# Patient Record
Sex: Female | Born: 1995 | Race: White | Hispanic: No | Marital: Single | Smoking: Current every day smoker
Health system: Southern US, Community
[De-identification: ages and names within clinical notes are randomized; demographics above are authoritative.]

---
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
## 2014-03-01 ENCOUNTER — Ambulatory Visit (INDEPENDENT_AMBULATORY_CARE_PROVIDER_SITE_OTHER): Payer: Medicaid Other | Admitting: Nurse Practitioner

## 2014-03-01 VITALS — BP 107/73 | HR 76 | Temp 98.6°F | Ht 65.01 in | Wt 181.0 lb

## 2014-03-01 DIAGNOSIS — N912 Amenorrhea, unspecified: Secondary | ICD-10-CM

## 2014-03-01 DIAGNOSIS — B354 Tinea corporis: Secondary | ICD-10-CM

## 2014-03-01 LAB — POCT URINE PREGNANCY: Preg Test, Ur: NEGATIVE

## 2014-03-01 MED ORDER — NORGESTIM-ETH ESTRAD TRIPHASIC 0.18/0.215/0.25 MG-35 MCG PO TABS: 1.0000 | ORAL_TABLET | Freq: Every day | ORAL | Status: AC

## 2014-03-01 MED ORDER — CLOTRIMAZOLE 1 % EX CREA: 1.0000 "application " | TOPICAL_CREAM | Freq: Two times a day (BID) | CUTANEOUS | Status: AC

## 2014-03-01 NOTE — Patient Instructions (Signed)

## 2014-03-01 NOTE — Progress Notes (Signed)
   Subjective:    Patient ID: Byanka Connett, female    DOB: 03/17/1996, 18 y.o.   MRN: [id number]  HPI Patient in c/o a rash on right upper flank- has bben there for 2 weeks  * Patient also c/o no menses in over 3 months she thinks- she really does not keep up with it.  Review of Systems  Constitutional: Negative.   HENT: Negative.   Respiratory: Negative.   Cardiovascular: Negative.   Genitourinary: Negative.   Neurological: Negative.   Psychiatric/Behavioral: Negative.   All other systems reviewed and are negative.      Objective:   Physical Exam  Constitutional: She is oriented to person, place, and time. She appears well-developed and well-nourished.  Cardiovascular: Normal rate, regular rhythm and normal heart sounds.   Pulmonary/Chest: Effort normal and breath sounds normal.  Abdominal: Soft. Bowel sounds are normal.  Neurological: She is alert and oriented to person, place, and time.  Skin:  4 cm annular macular lesion with central clearing right upper flank.    BP 107/73 mmHg  Pulse 76  Temp(Src) 98.6 F (37 C) (Oral)  Ht 5' 5.01" (1.651 m)  Wt 181 lb (82.101 kg)  BMI 30.12 kg/m2  LMP 11/29/2013       Assessment & Plan:  1. Amenorrhea DO NOT start birth control until have a period- then start them on Sunday after wards Safe sex encouraged - POCT urine pregnancy - Norgestimate-Ethinyl Estradiol Triphasic 0.18/0.215/0.25 MG-35 MCG tablet; Take 1 tablet by mouth daily.  Dispense: 1 Package; Refill: 11  2. Tinea corporis Avoid scratching or rubbing Good hand washing after applying cream Can take up to 6 weeks to completely go away - clotrimazole (LOTRIMIN AF) 1 % cream; Apply 1 application topically 2 (two) times daily.  Dispense: 30 g; Refill: 0   Mary-Margaret Daphine DeutscherMartin, FNP

## 2014-05-14 ENCOUNTER — Ambulatory Visit: Payer: Medicaid Other | Admitting: Family

## 2014-08-03 ENCOUNTER — Emergency Department (HOSPITAL_COMMUNITY)
Admission: EM | Admit: 2014-08-03 | Discharge: 2014-08-03 | Disposition: A | Discharge status: Home / Self Care | Payer: Medicaid Other | Attending: Emergency Medicine | Admitting: Emergency Medicine

## 2014-08-03 ENCOUNTER — Encounter (HOSPITAL_COMMUNITY): Payer: Self-pay | Admitting: Emergency Medicine

## 2014-08-03 DIAGNOSIS — R69 Illness, unspecified: Secondary | ICD-10-CM

## 2014-08-03 DIAGNOSIS — Z79899 Other long term (current) drug therapy: Secondary | ICD-10-CM | POA: Diagnosis not present

## 2014-08-03 DIAGNOSIS — R1110 Vomiting, unspecified: Secondary | ICD-10-CM | POA: Diagnosis present

## 2014-08-03 DIAGNOSIS — Z720 Tobacco use: Secondary | ICD-10-CM | POA: Diagnosis not present

## 2014-08-03 DIAGNOSIS — J111 Influenza due to unidentified influenza virus with other respiratory manifestations: Secondary | ICD-10-CM | POA: Diagnosis not present

## 2014-08-03 MED ORDER — BENZONATATE 100 MG PO CAPS
100.0000 mg | ORAL_CAPSULE | Freq: Once | ORAL | Status: AC
  Administered 2014-08-03: 100 mg via ORAL
  Filled 2014-08-03: qty 1

## 2014-08-03 MED ORDER — ALBUTEROL SULFATE HFA 108 (90 BASE) MCG/ACT IN AERS
2.0000 | INHALATION_SPRAY | RESPIRATORY_TRACT | Status: AC
  Administered 2014-08-03: 2 via RESPIRATORY_TRACT
  Filled 2014-08-03: qty 6.7

## 2014-08-03 MED ORDER — IBUPROFEN 800 MG PO TABS: 800.0000 mg | ORAL_TABLET | Freq: Three times a day (TID) | ORAL | Status: DC

## 2014-08-03 MED ORDER — BENZONATATE 100 MG PO CAPS: 100.0000 mg | ORAL_CAPSULE | Freq: Three times a day (TID) | ORAL | Status: AC

## 2014-08-03 MED ORDER — KETOROLAC TROMETHAMINE 60 MG/2ML IM SOLN
60.0000 mg | Freq: Once | INTRAMUSCULAR | Status: AC
  Administered 2014-08-03: 60 mg via INTRAMUSCULAR
  Filled 2014-08-03: qty 2

## 2014-08-03 NOTE — Discharge Instructions (Signed)
Please call your doctor for a followup appointment within 24-48 hours. When you talk to your doctor please let them know that you were seen in the emergency department and have them acquire all of your records so that they can discuss the findings with you and formulate a treatment plan to fully care for your new and ongoing problems. ° °

## 2014-08-03 NOTE — ED Notes (Signed)
Pt reports cough,emesis,headache,diarrhea,ear pain x3 days.

## 2014-08-03 NOTE — ED Provider Notes (Signed)
CSN: [id number]     Arrival date & time 08/03/14  1144 History  This chart was scribed for Stephanie HongBrian Shenay Torti, MD by Phillis HaggisGabriella Gaje, ED Scribe. This patient was seen in room APA19/[facility]19 and patient care was started at 12:02 PM.   Chief Complaint  Patient presents with  . Emesis   Patient is a 19 y.o. female presenting with vomiting. The history is provided by the patient. No language interpreter was used.  Emesis Associated symptoms: headaches and myalgias     HPI Comments: Stephanie Walls is a 19 y.o. female who presents to the Emergency Department complaining of cough, migraine, and chest pain onset 3 days ago.  She reports most pain in her chest from her coughing. She reports associated subjective fever, chills, runny nose, one episode of hematochezia, diarrhea, vomiting, and sore throat. She denies a productive cough states that the purulence from her nose is clear. She denies taking a flu shot this year.   Symptoms are persistent, gradually worsening, not associated with rashes or swelling of the lower extremities  She does have a sick contact in a best friend with similar symptoms.   History reviewed. No pertinent past medical history. History reviewed. No pertinent past surgical history. History reviewed. No pertinent family history. History  Substance Use Topics  . Smoking status: Current Every Day Smoker -- 1.00 packs/day  . Smokeless tobacco: Not on file  . Alcohol Use: Yes     Comment: 1 bottle of liquor every 2 days.   OB History    No data available     Review of Systems  Constitutional: Positive for fever (subjective).  Respiratory: Positive for cough.   Musculoskeletal: Positive for myalgias.  Neurological: Positive for headaches.  All other systems reviewed and are negative.  Allergies  Review of patient's allergies indicates no known allergies.  Home Medications   Prior to Admission medications   Medication Sig Start Date End Date Taking? Authorizing Provider   benzonatate (TESSALON) 100 MG capsule Take 1 capsule (100 mg total) by mouth every 8 (eight) hours. 08/03/14   Stephanie HongBrian Shruti Arrey, MD  clotrimazole (LOTRIMIN AF) 1 % cream Apply 1 application topically 2 (two) times daily. 03/01/14   Mary-Margaret Daphine DeutscherMartin, FNP  ibuprofen (ADVIL,MOTRIN) 800 MG tablet Take 1 tablet (800 mg total) by mouth 3 (three) times daily. 08/03/14   Stephanie HongBrian Renesmee Raine, MD  Norgestimate-Ethinyl Estradiol Triphasic 0.18/0.215/0.25 MG-35 MCG tablet Take 1 tablet by mouth daily. 03/01/14   Mary-Margaret Daphine DeutscherMartin, FNP   BP 95/66 mmHg  Pulse 99  Temp(Src) 99.2 F (37.3 C) (Oral)  Resp 18  Ht 5\' 7"  (1.702 m)  Wt 180 lb (81.647 kg)  BMI 28.19 kg/m2  SpO2 95%  LMP 06/04/2014   Physical Exam  Constitutional: She appears well-developed and well-nourished. No distress.  HENT:  Head: Normocephalic and atraumatic.  Mouth/Throat: Oropharynx is clear and moist. No oropharyngeal exudate.  Mild erythema to the posterior pharynx; frequent coughing during exam  Eyes: Conjunctivae and EOM are normal. Pupils are equal, round, and reactive to light. Right eye exhibits no discharge. Left eye exhibits no discharge. No scleral icterus.  Neck: Normal range of motion. Neck supple. No JVD present. No thyromegaly present.  Cardiovascular: Normal rate, regular rhythm, normal heart sounds and intact distal pulses.  Exam reveals no gallop and no friction rub.   No murmur heard. Pulmonary/Chest: Effort normal and breath sounds normal. No respiratory distress. She has no wheezes. She has no rales.  Abdominal: Soft. Bowel sounds are normal.  She exhibits no distension and no mass. There is no tenderness.  Musculoskeletal: Normal range of motion. She exhibits no edema or tenderness.  Lymphadenopathy:    She has no cervical adenopathy.  Neurological: She is alert. Coordination normal.  Skin: Skin is warm and dry. No rash noted. No erythema.  Psychiatric: She has a normal mood and affect. Her behavior is normal.   Nursing note and vitals reviewed.   ED Course  Procedures (including critical care time) DIAGNOSTIC STUDIES: Oxygen Saturation is 95% on room air, normal by my interpretation.    COORDINATION OF CARE: 12:12 PM-Discussed treatment plan which includes inhaler with pt at bedside and pt agreed to plan.   Labs Review Labs Reviewed - No data to display  Imaging Review No results found.    MDM   Final diagnoses:  Influenza-like illness     The patient is well-appearing, she does have frequent coughing but it is a dry nonproductive cough, she has normal lung sounds, her vital signs reveal no signs of hypoxia or fever, she is not tachycardic, she has likely developed a viral infection similar to her friend and is stable for discharge. Medications given as below.  Meds given in ED:  Medications  albuterol (PROVENTIL HFA;VENTOLIN HFA) 108 (90 BASE) MCG/ACT inhaler 2 puff (2 puffs Inhalation Given 08/03/14 1228)  ketorolac (TORADOL) injection 60 mg (60 mg Intramuscular Given 08/03/14 1228)  benzonatate (TESSALON) capsule 100 mg (100 mg Oral Given 08/03/14 1228)    Discharge Medication List as of 08/03/2014 12:19 PM    START taking these medications   Details  benzonatate (TESSALON) 100 MG capsule Take 1 capsule (100 mg total) by mouth every 8 (eight) hours., Starting 08/03/2014, Until Discontinued, Print    ibuprofen (ADVIL,MOTRIN) 800 MG tablet Take 1 tablet (800 mg total) by mouth 3 (three) times daily., Starting 08/03/2014, Until Discontinued, Print         I personally performed the services described in this documentation, which was scribed in my presence. The recorded information has been reviewed and is accurate.      Stephanie Hong, MD 08/03/14 [phone]

## 2014-08-04 DIAGNOSIS — Z793 Long term (current) use of hormonal contraceptives: Secondary | ICD-10-CM | POA: Diagnosis not present

## 2014-08-04 DIAGNOSIS — R079 Chest pain, unspecified: Secondary | ICD-10-CM | POA: Insufficient documentation

## 2014-08-04 DIAGNOSIS — Z720 Tobacco use: Secondary | ICD-10-CM | POA: Insufficient documentation

## 2014-08-04 DIAGNOSIS — E876 Hypokalemia: Secondary | ICD-10-CM | POA: Diagnosis not present

## 2014-08-04 DIAGNOSIS — R112 Nausea with vomiting, unspecified: Secondary | ICD-10-CM | POA: Diagnosis not present

## 2014-08-04 DIAGNOSIS — R197 Diarrhea, unspecified: Secondary | ICD-10-CM | POA: Insufficient documentation

## 2014-08-04 DIAGNOSIS — Z79899 Other long term (current) drug therapy: Secondary | ICD-10-CM | POA: Diagnosis not present

## 2014-08-04 DIAGNOSIS — R05 Cough: Secondary | ICD-10-CM | POA: Diagnosis present

## 2014-08-04 DIAGNOSIS — J209 Acute bronchitis, unspecified: Secondary | ICD-10-CM | POA: Insufficient documentation

## 2014-08-04 NOTE — ED Notes (Signed)
Pt c/o n/v/d and coughing. Pt was seen for the same earlier this week.

## 2014-08-05 ENCOUNTER — Encounter (HOSPITAL_COMMUNITY): Payer: Self-pay | Admitting: Emergency Medicine

## 2014-08-05 ENCOUNTER — Emergency Department (HOSPITAL_COMMUNITY)
Admission: EM | Admit: 2014-08-05 | Discharge: 2014-08-05 | Disposition: A | Discharge status: Home / Self Care | Payer: Medicaid Other | Attending: Emergency Medicine | Admitting: Emergency Medicine

## 2014-08-05 ENCOUNTER — Emergency Department (HOSPITAL_COMMUNITY): Payer: Medicaid Other

## 2014-08-05 DIAGNOSIS — J209 Acute bronchitis, unspecified: Secondary | ICD-10-CM

## 2014-08-05 DIAGNOSIS — R05 Cough: Secondary | ICD-10-CM

## 2014-08-05 DIAGNOSIS — E876 Hypokalemia: Secondary | ICD-10-CM

## 2014-08-05 DIAGNOSIS — R059 Cough, unspecified: Secondary | ICD-10-CM

## 2014-08-05 DIAGNOSIS — R1110 Vomiting, unspecified: Secondary | ICD-10-CM

## 2014-08-05 LAB — CBC WITH DIFFERENTIAL/PLATELET
BASOS PCT: 0 % (ref 0–1)
Basophils Absolute: 0 10*3/uL (ref 0.0–0.1)
Eosinophils Absolute: 0 10*3/uL (ref 0.0–0.7)
Eosinophils Relative: 0 % (ref 0–5)
HCT: 41.9 % (ref 36.0–46.0)
HEMOGLOBIN: 14.4 g/dL (ref 12.0–15.0)
LYMPHS PCT: 20 % (ref 12–46)
Lymphs Abs: 0.7 10*3/uL (ref 0.7–4.0)
MCH: 31.4 pg (ref 26.0–34.0)
MCHC: 34.4 g/dL (ref 30.0–36.0)
MCV: 91.5 fL (ref 78.0–100.0)
MONOS PCT: 6 % (ref 3–12)
Monocytes Absolute: 0.2 10*3/uL (ref 0.1–1.0)
NEUTROS ABS: 2.7 10*3/uL (ref 1.7–7.7)
Neutrophils Relative %: 74 % (ref 43–77)
Platelets: 175 10*3/uL (ref 150–400)
RBC: 4.58 MIL/uL (ref 3.87–5.11)
RDW: 13.2 % (ref 11.5–15.5)
WBC: 3.6 10*3/uL — AB (ref 4.0–10.5)

## 2014-08-05 LAB — BASIC METABOLIC PANEL
Anion gap: 9 (ref 5–15)
BUN: 8 mg/dL (ref 6–23)
CHLORIDE: 100 mmol/L (ref 96–112)
CO2: 24 mmol/L (ref 19–32)
CREATININE: 1.08 mg/dL (ref 0.50–1.10)
Calcium: 8 mg/dL — ABNORMAL LOW (ref 8.4–10.5)
GFR, EST AFRICAN AMERICAN: 86 mL/min — AB (ref >90)
GFR, EST NON AFRICAN AMERICAN: 74 mL/min — AB (ref >90)
GLUCOSE: 101 mg/dL — AB (ref 70–99)
POTASSIUM: 3.1 mmol/L — AB (ref 3.5–5.1)
Sodium: 133 mmol/L — ABNORMAL LOW (ref 135–145)

## 2014-08-05 MED ORDER — SODIUM CHLORIDE 0.9 % IV SOLN
1000.0000 mL | Freq: Once | INTRAVENOUS | Status: AC
  Administered 2014-08-05: 1000 mL via INTRAVENOUS

## 2014-08-05 MED ORDER — ACETAMINOPHEN 325 MG PO TABS
ORAL_TABLET | ORAL | Status: DC
Start: 2014-08-05 — End: 2014-08-05
  Filled 2014-08-05: qty 1

## 2014-08-05 MED ORDER — DEXAMETHASONE SODIUM PHOSPHATE 10 MG/ML IJ SOLN
10.0000 mg | Freq: Once | INTRAMUSCULAR | Status: AC
  Administered 2014-08-05: 10 mg via INTRAVENOUS
  Filled 2014-08-05: qty 1

## 2014-08-05 MED ORDER — IPRATROPIUM-ALBUTEROL 0.5-2.5 (3) MG/3ML IN SOLN
3.0000 mL | Freq: Once | RESPIRATORY_TRACT | Status: AC
  Administered 2014-08-05: 3 mL via RESPIRATORY_TRACT
  Filled 2014-08-05: qty 3

## 2014-08-05 MED ORDER — ACETAMINOPHEN 325 MG PO TABS
ORAL_TABLET | ORAL | Status: AC
  Administered 2014-08-05: 650 mg
  Filled 2014-08-05: qty 2

## 2014-08-05 MED ORDER — IPRATROPIUM-ALBUTEROL 0.5-2.5 (3) MG/3ML IN SOLN
3.0000 mL | Freq: Once | RESPIRATORY_TRACT | Status: AC
  Administered 2014-08-05: 3 mL via RESPIRATORY_TRACT

## 2014-08-05 MED ORDER — KETOROLAC TROMETHAMINE 30 MG/ML IJ SOLN
30.0000 mg | Freq: Once | INTRAMUSCULAR | Status: AC
  Administered 2014-08-05: 30 mg via INTRAVENOUS
  Filled 2014-08-05: qty 1

## 2014-08-05 MED ORDER — IPRATROPIUM-ALBUTEROL 0.5-2.5 (3) MG/3ML IN SOLN
RESPIRATORY_TRACT | Status: AC
  Filled 2014-08-05: qty 3

## 2014-08-05 MED ORDER — SODIUM CHLORIDE 0.9 % IV SOLN: 1000.0000 mL | INTRAVENOUS | Status: DC

## 2014-08-05 MED ORDER — POTASSIUM CHLORIDE CRYS ER 20 MEQ PO TBCR: 20.0000 meq | EXTENDED_RELEASE_TABLET | Freq: Two times a day (BID) | ORAL | Status: AC

## 2014-08-05 MED ORDER — POTASSIUM CHLORIDE 10 MEQ/100ML IV SOLN
10.0000 meq | Freq: Once | INTRAVENOUS | Status: AC
  Administered 2014-08-05: 10 meq via INTRAVENOUS
  Filled 2014-08-05: qty 100

## 2014-08-05 MED ORDER — PREDNISONE 50 MG PO TABS: 50.0000 mg | ORAL_TABLET | Freq: Every day | ORAL | Status: AC

## 2014-08-05 NOTE — Discharge Instructions (Signed)
Use your inhaler every four hours as needed to suppress the cough.   Acute Bronchitis Bronchitis is inflammation of the airways that extend from the windpipe into the lungs (bronchi). The inflammation often causes mucus to develop. This leads to a cough, which is the most common symptom of bronchitis.  In acute bronchitis, the condition usually develops suddenly and goes away over time, usually in a couple weeks. Smoking, allergies, and asthma can make bronchitis worse. Repeated episodes of bronchitis may cause further lung problems.  CAUSES Acute bronchitis is most often caused by the same virus that causes a cold. The virus can spread from person to person (contagious) through coughing, sneezing, and touching contaminated objects. SIGNS AND SYMPTOMS   Cough.   Fever.   Coughing up mucus.   Body aches.   Chest congestion.   Chills.   Shortness of breath.   Sore throat.  DIAGNOSIS  Acute bronchitis is usually diagnosed through a physical exam. Your health care provider will also ask you questions about your medical history. Tests, such as chest X-rays, are sometimes done to rule out other conditions.  TREATMENT  Acute bronchitis usually goes away in a couple weeks. Oftentimes, no medical treatment is necessary. Medicines are sometimes given for relief of fever or cough. Antibiotic medicines are usually not needed but may be prescribed in certain situations. In some cases, an inhaler may be recommended to help reduce shortness of breath and control the cough. A cool mist vaporizer may also be used to help thin bronchial secretions and make it easier to clear the chest.  HOME CARE INSTRUCTIONS  Get plenty of rest.   Drink enough fluids to keep your urine clear or pale yellow (unless you have a medical condition that requires fluid restriction). Increasing fluids may help thin your respiratory secretions (sputum) and reduce chest congestion, and it will prevent dehydration.    Take medicines only as directed by your health care provider.  If you were prescribed an antibiotic medicine, finish it all even if you start to feel better.  Avoid smoking and secondhand smoke. Exposure to cigarette smoke or irritating chemicals will make bronchitis worse. If you are a smoker, consider using nicotine gum or skin patches to help control withdrawal symptoms. Quitting smoking will help your lungs heal faster.   Reduce the chances of another bout of acute bronchitis by washing your hands frequently, avoiding people with cold symptoms, and trying not to touch your hands to your mouth, nose, or eyes.   Keep all follow-up visits as directed by your health care provider.  SEEK MEDICAL CARE IF: Your symptoms do not improve after 1 week of treatment.  SEEK IMMEDIATE MEDICAL CARE IF:  You develop an increased fever or chills.   You have chest pain.   You have severe shortness of breath.  You have bloody sputum.   You develop dehydration.  You faint or repeatedly feel like you are going to pass out.  You develop repeated vomiting.  You develop a severe headache. MAKE SURE YOU:   Understand these instructions.  Will watch your condition.  Will get help right away if you are not doing well or get worse. Document Released: 05/05/2004 Document Revised: 08/12/2013 Document Reviewed: 09/18/2012 [facility] Patient Information 2015 [place], [place]. This information is not intended to replace advice given to you by your health care provider. Make sure you discuss any questions you have with your health care provider.  Hypokalemia Hypokalemia means that the amount of potassium in  the blood is lower than normal.Potassium is a chemical, called an electrolyte, that helps regulate the amount of fluid in the body. It also stimulates muscle contraction and helps nerves function properly.Most of the body's potassium is inside of cells, and only a very small amount is in the  blood. Because the amount in the blood is so small, minor changes can be life-threatening. CAUSES  Antibiotics.  Diarrhea or vomiting.  Using laxatives too much, which can cause diarrhea.  Chronic kidney disease.  Water pills (diuretics).  Eating disorders (bulimia).  Low magnesium level.  Sweating a lot. SIGNS AND SYMPTOMS  Weakness.  Constipation.  Fatigue.  Muscle cramps.  Mental confusion.  Skipped heartbeats or irregular heartbeat (palpitations).  Tingling or numbness. DIAGNOSIS  Your health care provider can diagnose hypokalemia with blood tests. In addition to checking your potassium level, your health care provider may also check other lab tests. TREATMENT Hypokalemia can be treated with potassium supplements taken by mouth or adjustments in your current medicines. If your potassium level is very low, you may need to get potassium through a vein (IV) and be monitored in the hospital. A diet high in potassium is also helpful. Foods high in potassium are:  Nuts, such as peanuts and pistachios.  Seeds, such as sunflower seeds and pumpkin seeds.  Peas, lentils, and lima beans.  Whole grain and bran cereals and breads.  Fresh fruit and vegetables, such as apricots, avocado, bananas, cantaloupe, kiwi, oranges, tomatoes, asparagus, and potatoes.  Orange and tomato juices.  Red meats.  Fruit yogurt. HOME CARE INSTRUCTIONS  Take all medicines as prescribed by your health care provider.  Maintain a healthy diet by including nutritious food, such as fruits, vegetables, nuts, whole grains, and lean meats.  If you are taking a laxative, be sure to follow the directions on the label. SEEK MEDICAL CARE IF:  Your weakness gets worse.  You feel your heart pounding or racing.  You are vomiting or having diarrhea.  You are diabetic and having trouble keeping your blood glucose in the normal range. SEEK IMMEDIATE MEDICAL CARE IF:  You have chest pain,  shortness of breath, or dizziness.  You are vomiting or having diarrhea for more than 2 days.  You faint. MAKE SURE YOU:   Understand these instructions.  Will watch your condition.  Will get help right away if you are not doing well or get worse. Document Released: 03/28/2005 Document Revised: 01/16/2013 Document Reviewed: 09/28/2012 [facility] Patient Information 2015 [place], [place]. This information is not intended to replace advice given to you by your health care provider. Make sure you discuss any questions you have with your health care provider.  Prednisone tablets What is this medicine? PREDNISONE (PRED ni sone) is a corticosteroid. It is commonly used to treat inflammation of the skin, joints, lungs, and other organs. Common conditions treated include asthma, allergies, and arthritis. It is also used for other conditions, such as blood disorders and diseases of the adrenal glands. This medicine may be used for other purposes; ask your health care provider or pharmacist if you have questions. COMMON BRAND NAME(S): Deltasone, Predone, Sterapred, Sterapred DS What should I tell my health care provider before I take this medicine? They need to know if you have any of these conditions: -Cushing's syndrome -diabetes -glaucoma -heart disease -high blood pressure -infection (especially a virus infection such as chickenpox, cold sores, or herpes) -kidney disease -liver disease -mental illness -myasthenia gravis -osteoporosis -seizures -stomach or intestine problems -thyroid disease -  an unusual or allergic reaction to lactose, prednisone, other medicines, foods, dyes, or preservatives -pregnant or trying to get pregnant -breast-feeding How should I use this medicine? Take this medicine by mouth with a glass of water. Follow the directions on the prescription label. Take this medicine with food. If you are taking this medicine once a day, take it in the morning. Do not take more  medicine than you are told to take. Do not suddenly stop taking your medicine because you may develop a severe reaction. Your doctor will tell you how much medicine to take. If your doctor wants you to stop the medicine, the dose may be slowly lowered over time to avoid any side effects. Talk to your pediatrician regarding the use of this medicine in children. Special care may be needed. Overdosage: If you think you have taken too much of this medicine contact a poison control center or emergency room at once. NOTE: This medicine is only for you. Do not share this medicine with others. What if I miss a dose? If you miss a dose, take it as soon as you can. If it is almost time for your next dose, talk to your doctor or health care professional. You may need to miss a dose or take an extra dose. Do not take double or extra doses without advice. What may interact with this medicine? Do not take this medicine with any of the following medications: -metyrapone -mifepristone This medicine may also interact with the following medications: -aminoglutethimide -amphotericin B -aspirin and aspirin-like medicines -barbiturates -certain medicines for diabetes, like glipizide or glyburide -cholestyramine -cholinesterase inhibitors -cyclosporine -digoxin -diuretics -ephedrine -female hormones, like estrogens and birth control pills -isoniazid -ketoconazole -NSAIDS, medicines for pain and inflammation, like ibuprofen or naproxen -phenytoin -rifampin -toxoids -vaccines -warfarin This list may not describe all possible interactions. Give your health care provider a list of all the medicines, herbs, non-prescription drugs, or dietary supplements you use. Also tell them if you smoke, drink alcohol, or use illegal drugs. Some items may interact with your medicine. What should I watch for while using this medicine? Visit your doctor or health care professional for regular checks on your progress. If you  are taking this medicine over a prolonged period, carry an identification card with your name and address, the type and dose of your medicine, and your doctor's name and address. This medicine may increase your risk of getting an infection. Tell your doctor or health care professional if you are around anyone with measles or chickenpox, or if you develop sores or blisters that do not heal properly. If you are going to have surgery, tell your doctor or health care professional that you have taken this medicine within the last twelve months. Ask your doctor or health care professional about your diet. You may need to lower the amount of salt you eat. This medicine may affect blood sugar levels. If you have diabetes, check with your doctor or health care professional before you change your diet or the dose of your diabetic medicine. What side effects may I notice from receiving this medicine? Side effects that you should report to your doctor or health care professional as soon as possible: -allergic reactions like skin rash, itching or hives, swelling of the face, lips, or tongue -changes in emotions or moods -changes in vision -depressed mood -eye pain -fever or chills, cough, sore throat, pain or difficulty passing urine -increased thirst -swelling of ankles, feet Side effects that usually do not require  medical attention (report to your doctor or health care professional if they continue or are bothersome): -confusion, excitement, restlessness -headache -nausea, vomiting -skin problems, acne, thin and shiny skin -trouble sleeping -weight gain This list may not describe all possible side effects. Call your doctor for medical advice about side effects. You may report side effects to FDA at 1-800-FDA-1088. Where should I keep my medicine? Keep out of the reach of children. Store at room temperature between 15 and 30 degrees C (59 and 86 degrees F). Protect from light. Keep container tightly  closed. Throw away any unused medicine after the expiration date. NOTE: This sheet is a summary. It may not cover all possible information. If you have questions about this medicine, talk to your doctor, pharmacist, or health care provider.  2015, Elsevier/Gold Standard. (2010-11-11 10:57:14)  Potassium Salts tablets, extended-release tablets or capsules What is this medicine? POTASSIUM (poe TASS i um) is a natural salt that is important for the heart, muscles, and nerves. It is found in many foods and is normally supplied by a well balanced diet. This medicine is used to treat low potassium. This medicine may be used for other purposes; ask your health care provider or pharmacist if you have questions. COMMON BRAND NAME(S): ED-K+10, Glu-K, K-10, K-8, K-Dur, K-Tab, Kaon-CL, Klor-Con, Klor-Con M10, Klor-Con M15, Klor-Con M20, Klotrix, Micro-K, Micro-K Extencaps, Slow-K What should I tell my health care provider before I take this medicine? They need to know if you have any of these conditions: -dehydration -diabetes -irregular heartbeat -kidney disease -stomach ulcers or other stomach problems -an unusual or allergic reaction to potassium salts, other medicines, foods, dyes, or preservatives -pregnant or trying to get pregnant -breast-feeding How should I use this medicine? Take this medicine by mouth with a full glass of water. Follow the directions on the prescription label. Take with food. Do not suck on, crush, or chew this medicine. If you have difficulty swallowing, ask the pharmacist how to take. Take your medicine at regular intervals. Do not take it more often than directed. Do not stop taking except on your doctor's advice. Talk to your pediatrician regarding the use of this medicine in children. Special care may be needed. Overdosage: If you think you have taken too much of this medicine contact a poison control center or emergency room at once. NOTE: This medicine is only for you.  Do not share this medicine with others. What if I miss a dose? If you miss a dose, take it as soon as you can. If it is almost time for your next dose, take only that dose. Do not take double or extra doses. What may interact with this medicine? Do not take this medicine with any of the following medications: -eplerenone -sodium polystyrene sulfonate This medicine may also interact with the following medications: -medicines for blood pressure or heart disease like lisinopril, losartan, quinapril, valsartan -medicines for cold or allergies -medicines for inflammation like ibuprofen, indomethacin -medicines for Parkinson's disease -medicines for the stomach like metoclopramide, dicyclomine, glycopyrrolate -some diuretics This list may not describe all possible interactions. Give your health care provider a list of all the medicines, herbs, non-prescription drugs, or dietary supplements you use. Also tell them if you smoke, drink alcohol, or use illegal drugs. Some items may interact with your medicine. What should I watch for while using this medicine? Visit your doctor or health care professional for regular check ups. You will need lab work done regularly. You may need to be on a  special diet while taking this medicine. Ask your doctor. What side effects may I notice from receiving this medicine? Side effects that you should report to your doctor or health care professional as soon as possible: -allergic reactions like skin rash, itching or hives, swelling of the face, lips, or tongue -black, tarry stools -heartburn -irregular heartbeat -numbness or tingling in hands or feet -pain when swallowing -unusually weak or tired Side effects that usually do not require medical attention (report to your doctor or health care professional if they continue or are bothersome): -diarrhea -nausea -stomach gas -vomiting This list may not describe all possible side effects. Call your doctor for  medical advice about side effects. You may report side effects to FDA at 1-800-FDA-1088. Where should I keep my medicine? Keep out of the reach of children. Store at room temperature between 15 and 30 degrees C (59 and 86 degrees F ). Keep bottle closed tightly to protect this medicine from light and moisture. Throw away any unused medicine after the expiration date. NOTE: This sheet is a summary. It may not cover all possible information. If you have questions about this medicine, talk to your doctor, pharmacist, or health care provider.  2015, Elsevier/Gold Standard. (2007-06-13 11:17:31)

## 2014-08-05 NOTE — ED Notes (Signed)
Patient fetal heart monitor removed per patient request

## 2014-08-05 NOTE — ED Provider Notes (Signed)
CSN: [id number]     Arrival date & time 08/04/14  2358 History  This chart was scribed for Dione Boozeavid Chong Wojdyla, MD by Tanda RockersMargaux Venter, ED Scribe. This patient was seen in room APA01/APA01 and the patient's care was started at 12:12 AM.     Chief Complaint  Patient presents with  . Emesis   The history is provided by the patient. No language interpreter was used.    HPI Comments: Stephanie Walls is a 19 y.o. female brought in by ambulance, who presents to the Emergency Department for multiple complaints that began 4 days ago. Pt complains of productive cough with clear phlegm, mid chest pain from coughing, headache, chills, diaphoresis, sore throat, headache, posttussive vomiting, diarrhea, and subjective fever. She reports that she has had 3 episodes of diarrhea today. Pt mentions that she has been unable to sleep due to the symptoms and has not been eating or drinking like she should be. Pt was seen in the ED on 4/24 (2 days ago) for the same symptoms and diagnosed with influenza like illness. She was given prescription for Tessalon and Ibuprofen which she states has not been helping. She does report recent sick contact with similar symptoms.   History reviewed. No pertinent past medical history. History reviewed. No pertinent past surgical history. History reviewed. No pertinent family history. History  Substance Use Topics  . Smoking status: Current Every Day Smoker -- 1.00 packs/day  . Smokeless tobacco: Not on file  . Alcohol Use: Yes     Comment: 1 bottle of liquor every 2 days.   OB History    No data available     Review of Systems  Constitutional: Positive for fever, chills and diaphoresis.  HENT: Positive for sore throat.   Respiratory: Positive for cough.   Cardiovascular: Positive for chest pain.  Gastrointestinal: Positive for nausea, vomiting and diarrhea.  Neurological: Positive for headaches.  All other systems reviewed and are negative.     Allergies  Review of  patient's allergies indicates no known allergies.  Home Medications   Prior to Admission medications   Medication Sig Start Date End Date Taking? Authorizing Provider  benzonatate (TESSALON) 100 MG capsule Take 1 capsule (100 mg total) by mouth every 8 (eight) hours. 08/03/14   Eber HongBrian Miller, MD  clotrimazole (LOTRIMIN AF) 1 % cream Apply 1 application topically 2 (two) times daily. 03/01/14   Mary-Margaret Daphine DeutscherMartin, FNP  ibuprofen (ADVIL,MOTRIN) 800 MG tablet Take 1 tablet (800 mg total) by mouth 3 (three) times daily. 08/03/14   Eber HongBrian Miller, MD  Norgestimate-Ethinyl Estradiol Triphasic 0.18/0.215/0.25 MG-35 MCG tablet Take 1 tablet by mouth daily. 03/01/14   Mary-Margaret Daphine DeutscherMartin, FNP   Triage Vitals: BP 116/71 mmHg  Pulse 109  Temp(Src) 103.1 F (39.5 C) (Oral)  Resp 23  Wt 180 lb (81.647 kg)  SpO2 95%  LMP 06/04/2014   Physical Exam  Constitutional: She is oriented to person, place, and time. She appears well-developed and well-nourished. No distress.  HENT:  Head: Normocephalic and atraumatic.  Eyes: Conjunctivae and EOM are normal. Pupils are equal, round, and reactive to light.  Neck: Normal range of motion. Neck supple. No JVD present.  Cardiovascular: Normal rate, regular rhythm and normal heart sounds.   No murmur heard. Pulmonary/Chest: Effort normal. She has wheezes. She has no rales. She exhibits no tenderness.  Harsh breath sounds.  Wheezy cough.   Abdominal: Soft. She exhibits no distension and no mass. There is tenderness (Mild epigastric tenderness. ).  Bowel sounds  decreased.   Musculoskeletal: Normal range of motion. She exhibits no edema.  Lymphadenopathy:    She has no cervical adenopathy.  Neurological: She is alert and oriented to person, place, and time. No cranial nerve deficit. She exhibits normal muscle tone. Coordination normal.  Skin: Skin is warm and dry. No rash noted.  Psychiatric: She has a normal mood and affect. Her behavior is normal. Thought  content normal.  Nursing note and vitals reviewed.   ED Course  Procedures (including critical care time)  DIAGNOSTIC STUDIES: Oxygen Saturation is 95% on RA, normal by my interpretation.    COORDINATION OF CARE: 12:18 AM-Discussed treatment plan which includes CXR, CBC, BMP with pt at bedside and pt agreed to plan.   Labs Review Results for orders placed or performed during the hospital encounter of 08/05/14  Basic metabolic panel  Result Value Ref Range   Sodium 133 (L) 135 - 145 mmol/L   Potassium 3.1 (L) 3.5 - 5.1 mmol/L   Chloride 100 96 - 112 mmol/L   CO2 24 19 - 32 mmol/L   Glucose, Bld 101 (H) 70 - 99 mg/dL   BUN 8 6 - 23 mg/dL   Creatinine, Ser 4.09 0.50 - 1.10 mg/dL   Calcium 8.0 (L) 8.4 - 10.5 mg/dL   GFR calc non Af Amer 74 (L) >90 mL/min   GFR calc Af Amer 86 (L) >90 mL/min   Anion gap 9 5 - 15  CBC with Differential  Result Value Ref Range   WBC 3.6 (L) 4.0 - 10.5 K/uL   RBC 4.58 3.87 - 5.11 MIL/uL   Hemoglobin 14.4 12.0 - 15.0 g/dL   HCT 81.1 91.4 - 78.2 %   MCV 91.5 78.0 - 100.0 fL   MCH 31.4 26.0 - 34.0 pg   MCHC 34.4 30.0 - 36.0 g/dL   RDW 95.6 21.3 - 08.6 %   Platelets 175 150 - 400 K/uL   Neutrophils Relative % 74 43 - 77 %   Neutro Abs 2.7 1.7 - 7.7 K/uL   Lymphocytes Relative 20 12 - 46 %   Lymphs Abs 0.7 0.7 - 4.0 K/uL   Monocytes Relative 6 3 - 12 %   Monocytes Absolute 0.2 0.1 - 1.0 K/uL   Eosinophils Relative 0 0 - 5 %   Eosinophils Absolute 0.0 0.0 - 0.7 K/uL   Basophils Relative 0 0 - 1 %   Basophils Absolute 0.0 0.0 - 0.1 K/uL   Imaging Review Dg Chest 2 View  08/05/2014   CLINICAL DATA:  20 year old female with productive cough and mid chest pain  EXAM: CHEST  2 VIEW  COMPARISON:  None.  FINDINGS: No focal airspace consolidation. Diffuse bronchial wall thickening/ peribronchial cuffing most notable on the lateral view. Cardiac and mediastinal contours are within normal limits. No pleural effusion or pneumothorax. No acute osseous  abnormality.  IMPRESSION: 1. Perihilar and lower lobe bronchial wall thickening. Differential considerations include acute bronchitis, viral respiratory infection and chronic inflammatory process such as asthma. 2. No focal airspace consolidation to suggest pneumonia.   Electronically Signed   By: Malachy Moan M.D.   On: 08/05/2014 02:01    MDM   Final diagnoses:  Cough  Acute bronchitis, unspecified organism  Hypokalemia  Post-tussive emesis    Cough with posttussive emesis. Wheezy component is noted. She is given albuterol with ipratropium with significant improvement. WBC has come back low consistent with a viral illness. Potassium is come back low which is probably related  to emesis and she is given intravenous potassium. She is given a second albuterol with ipratropium nebulizer treatment with excellent suppression of cough. She is also given ketorolac for achiness and dexamethasone. Patient states that she has an inhaler at home so she is advised to use that. She is given prescription for prednisone and also for K-Dur.   I personally performed the services described in this documentation, which was scribed in my presence. The recorded information has been reviewed and is accurate.       Dione Booze, MD 08/05/14 [phone]

## 2014-08-12 ENCOUNTER — Emergency Department (HOSPITAL_COMMUNITY)
Admission: EM | Admit: 2014-08-12 | Discharge: 2014-08-12 | Discharge status: Against Medical Advice | Payer: Medicaid Other | Source: Home / Self Care

## 2014-08-12 ENCOUNTER — Encounter (HOSPITAL_COMMUNITY): Payer: Self-pay | Admitting: *Deleted

## 2014-08-12 ENCOUNTER — Encounter (HOSPITAL_COMMUNITY): Payer: Self-pay | Admitting: Emergency Medicine

## 2014-08-12 DIAGNOSIS — N764 Abscess of vulva: Secondary | ICD-10-CM | POA: Diagnosis present

## 2014-08-12 DIAGNOSIS — N751 Abscess of Bartholin's gland: Secondary | ICD-10-CM | POA: Diagnosis not present

## 2014-08-12 DIAGNOSIS — N750 Cyst of Bartholin's gland: Secondary | ICD-10-CM | POA: Insufficient documentation

## 2014-08-12 DIAGNOSIS — Z720 Tobacco use: Secondary | ICD-10-CM

## 2014-08-12 DIAGNOSIS — N898 Other specified noninflammatory disorders of vagina: Secondary | ICD-10-CM

## 2014-08-12 NOTE — ED Notes (Signed)
[facility]  The patient left without being seen after triage.

## 2014-08-12 NOTE — ED Notes (Signed)
Pt. reports painful cyst inside vagina onset this week , denies drainage or bleeding , no fever or chills.

## 2014-08-12 NOTE — ED Notes (Addendum)
Pt reporting cyst on vaginal wall for 3 days.  Reports pain increasing today.

## 2014-08-13 ENCOUNTER — Emergency Department (HOSPITAL_COMMUNITY)
Admission: EM | Admit: 2014-08-13 | Discharge: 2014-08-13 | Disposition: A | Discharge status: Home / Self Care | Payer: Medicaid Other | Attending: Emergency Medicine | Admitting: Emergency Medicine

## 2014-08-13 DIAGNOSIS — N750 Cyst of Bartholin's gland: Secondary | ICD-10-CM

## 2014-08-13 MED ORDER — LORAZEPAM 2 MG/ML IJ SOLN
1.0000 mg | Freq: Once | INTRAMUSCULAR | Status: AC
  Administered 2014-08-13: 1 mg via INTRAMUSCULAR
  Filled 2014-08-13: qty 1

## 2014-08-13 MED ORDER — DOXYCYCLINE HYCLATE 100 MG PO CAPS: 100.0000 mg | ORAL_CAPSULE | Freq: Two times a day (BID) | ORAL | Status: AC

## 2014-08-13 MED ORDER — BUPIVACAINE HCL (PF) 0.5 % IJ SOLN
10.0000 mL | Freq: Once | INTRAMUSCULAR | Status: AC
  Administered 2014-08-13: 10 mL
  Filled 2014-08-13: qty 30

## 2014-08-13 MED ORDER — MORPHINE SULFATE 4 MG/ML IJ SOLN
4.0000 mg | Freq: Once | INTRAMUSCULAR | Status: AC
Start: 2014-08-13 — End: 2014-08-13
  Administered 2014-08-13: 4 mg via INTRAMUSCULAR
  Filled 2014-08-13: qty 1

## 2014-08-13 MED ORDER — HYDROCODONE-ACETAMINOPHEN 5-325 MG PO TABS: 1.0000 | ORAL_TABLET | Freq: Four times a day (QID) | ORAL | Status: AC | PRN

## 2014-08-13 NOTE — ED Notes (Signed)
MD at bedside. 

## 2014-08-13 NOTE — ED Provider Notes (Signed)
CSN: [id number]     Arrival date & time 08/12/14  2242 History  This chart was scribed for Devoria AlbeIva Knoah Nedeau, MD by Richarda Overlieichard Holland, ED Scribe. This patient was seen in room APA14/APA14 and the patient's care was started 12:45 AM.   Chief Complaint  Patient presents with  . Abscess   The history is provided by the patient. No language interpreter was used.   HPI Comments: Stephanie Walls is a 19 y.o. female who presents to the Emergency Department complaining of a cyst on her left vaginal wall that she noticed 3 days ago. Pt complains of associated severe pain. She denies any drainage. Pt reports that family members (mother and sister) have had similar episodes. She states that she is currently taking ibuprofen because she is recovering from the flu. Pt reports that she smoked 0.5ppd. Pt reports no alleviating or exacerbating factors at this time. She denies vaginal drainage. She has never had this before.   [facility] [place]  History reviewed. No pertinent past medical history. History reviewed. No pertinent past surgical history. History reviewed. No pertinent family history. History  Substance Use Topics  . Smoking status: Current Every Day Smoker -- 1.00 packs/day  . Smokeless tobacco: Not on file  . Alcohol Use: Yes     Comment: 1 bottle of liquor every 2 days.   OB History    No data available     Review of Systems  Skin:       Abscess   All other systems reviewed and are negative.  Allergies  Review of patient's allergies indicates no known allergies.  Home Medications   Prior to Admission medications   Medication Sig Start Date End Date Taking? Authorizing Provider  benzonatate (TESSALON) 100 MG capsule Take 1 capsule (100 mg total) by mouth every 8 (eight) hours. 08/03/14   Eber HongBrian Miller, MD  clotrimazole (LOTRIMIN AF) 1 % cream Apply 1 application topically 2 (two) times daily. 03/01/14   Mary-Margaret Daphine DeutscherMartin, FNP  ibuprofen (ADVIL,MOTRIN) 800 MG tablet Take 1  tablet (800 mg total) by mouth 3 (three) times daily. 08/03/14   Eber HongBrian Miller, MD  Norgestimate-Ethinyl Estradiol Triphasic 0.18/0.215/0.25 MG-35 MCG tablet Take 1 tablet by mouth daily. 03/01/14   Mary-Margaret Daphine DeutscherMartin, FNP  potassium chloride SA (K-DUR,KLOR-CON) 20 MEQ tablet Take 1 tablet (20 mEq total) by mouth 2 (two) times daily. 08/05/14   Dione Boozeavid Glick, MD  predniSONE (DELTASONE) 50 MG tablet Take 1 tablet (50 mg total) by mouth daily. 08/05/14   Dione Boozeavid Glick, MD   BP 115/80 mmHg  Pulse 69  Temp(Src) 99.4 F (37.4 C) (Oral)  Ht 5\' 3"  (1.6 m)  Wt 180 lb (81.647 kg)  BMI 31.89 kg/m2  SpO2 99%  LMP 08/05/2014  Vital signs normal    Physical Exam  Constitutional: She is oriented to person, place, and time. She appears well-developed and well-nourished.  Non-toxic appearance. She does not appear ill. She appears distressed.  Patient crying  HENT:  Head: Normocephalic and atraumatic.  Right Ear: External ear normal.  Left Ear: External ear normal.  Nose: Nose normal. No mucosal edema or rhinorrhea.  Mouth/Throat: Oropharynx is clear and moist and mucous membranes are normal. No dental abscesses or uvula swelling.  Eyes: Conjunctivae and EOM are normal. Pupils are equal, round, and reactive to light.  Neck: Normal range of motion and full passive range of motion without pain. Neck supple.  Pulmonary/Chest: Effort normal. No respiratory distress. She has no rhonchi. She exhibits no  crepitus.  Abdominal: Normal appearance. There is no tenderness.  Genitourinary:  Swelling and redness of the lower left labia minora consistent with Bartholin's abscess.   Musculoskeletal: Normal range of motion. She exhibits no edema or tenderness.  Moves all extremities well.   Neurological: She is alert and oriented to person, place, and time. She has normal strength. No cranial nerve deficit.  Skin: Skin is warm, dry and intact. No rash noted. No erythema. No pallor.  Psychiatric: Her speech is normal.  Her mood appears anxious. Her affect is labile. She is agitated.  Nursing note and vitals reviewed.   ED Course  Procedures    Medications  morphine 4 MG/ML injection 4 mg (4 mg Intramuscular Given 08/13/14 0138)  LORazepam (ATIVAN) injection 1 mg (1 mg Intramuscular Given 08/13/14 0138)  bupivacaine (MARCAINE) 0.5 % injection 10 mL (10 mLs Infiltration Given 08/13/14 0138)    DIAGNOSTIC STUDIES: Oxygen Saturation is 99% on RA, normal by my interpretation.    COORDINATION OF CARE: 12:48 AM Discussed treatment plan with pt at bedside and pt agreed to plan.  I thought patient had a Bartholin abscess however after I and D done it appears to just have been a cyst. Despite getting medication prior to her procedure patient is very Tree surgeonmelodramatic. Her friend remained with her during the procedure.  INCISION AND DRAINAGE Performed by: Devoria AlbeKNAPP,Layla Kesling L Consent: Verbal consent obtained. Risks and benefits: risks, benefits and alternatives were discussed Type: abscess  Body area: left labia  Anesthesia: local infiltration  Incision was made with an 11 bladescalpel.  Local anesthetic: marcaine 0.5 %  Anesthetic total: 3 ml   Blunt dissection to break up loculations  Drainage: bloody  Patient tolerance: Patient tolerated the procedure well with no immediate complications.      Labs Review Labs Reviewed - No data to display  Imaging Review No results found.   EKG Interpretation None      MDM   Final diagnoses:  Bartholin's cyst   New Prescriptions   DOXYCYCLINE (VIBRAMYCIN) 100 MG CAPSULE    Take 1 capsule (100 mg total) by mouth 2 (two) times daily.   HYDROCODONE-ACETAMINOPHEN (NORCO/VICODIN) 5-325 MG PER TABLET    Take 1-2 tablets by mouth every 6 (six) hours as needed for moderate pain.    Plan discharge  Devoria AlbeIva Cleven Jansma, MD, FACEP    I personally performed the services described in this documentation, which was scribed in my presence. The recorded information has been  reviewed and considered.  Devoria AlbeIva Matilde Pottenger, MD, Concha PyoFACEP      Romolo Sieling, MD 08/13/14 [phone]

## 2014-08-13 NOTE — Discharge Instructions (Signed)
Soak in a tub of warm water for 30 minutes at least 4 times a day for comfort. Take the hydrocodone for pain with ibuprofen 600 mg 4 times a day. Take the antibiotics until gone. You are going to have some bleeding from the incision, you will need to wear a sanitary pad for that. It will be uncomfortable to urinate, you may need to urinate in the tub or shower.  If your symptoms get worse, call [facility] to be evaluated.    Bartholin's Cyst or Abscess Bartholin's glands are small glands located within the folds of skin (labia) along the sides of the lower opening of the vagina (birth canal). A cyst may develop when the duct of the gland becomes blocked. When this happens, fluid that accumulates within the cyst can become infected. This is known as an abscess. The Bartholin gland produces a mucous fluid to lubricate the outside of the vagina during sexual intercourse. SYMPTOMS   Patients with a small cyst may not have any symptoms.  Mild discomfort to severe pain depending on the size of the cyst and if it is infected (abscess).  Pain, redness, and swelling around the lower opening of the vagina.  Painful intercourse.  Pressure in the perineal area.  Swelling of the lips of the vagina (labia).  The cyst or abscess can be on one side or both sides of the vagina. DIAGNOSIS   A large swelling is seen in the lower vagina area by your caregiver.  Painful to touch.  Redness and pain, if it is an abscess. TREATMENT   Sometimes the cyst will go away on its own.  Apply warm wet compresses to the area or take hot sitz baths several times a day.  An incision to drain the cyst or abscess with local anesthesia.  Culture the pus, if it is an abscess.  Antibiotic treatment, if it is an abscess.  Cut open the gland and suture the edges to make the opening of the gland bigger (marsupialization).  Remove the whole gland if the cyst or abscess returns. PREVENTION   Practice good  hygiene.  Clean the vaginal area with a mild soap and soft cloth when bathing.  Do not rub hard in the vaginal area when bathing.  Protect the crotch area with a padded cushion if you take long bike rides or ride horses.  Be sure you are well lubricated when you have sexual intercourse. HOME CARE INSTRUCTIONS   If your cyst or abscess was opened, a small piece of gauze, or a drain, may have been placed in the wound to allow drainage. Do not remove this gauze or drain unless directed by your caregiver.  Wear feminine pads, not tampons, as needed for any drainage or bleeding.  If antibiotics were prescribed, take them exactly as directed. Finish the entire course.  Only take over-the-counter or prescription medicines for pain, discomfort, or fever as directed by your caregiver. SEEK IMMEDIATE MEDICAL CARE IF:   You have an increase in pain, redness, swelling, or drainage.  You have bleeding from the wound which results in the use of more than the number of pads suggested by your caregiver in 24 hours.  You have chills.  You have a fever.  You develop any new problems (symptoms) or aggravation of your existing condition. MAKE SURE YOU:   Understand these instructions.  Will watch your condition.  Will get help right away if you are not doing well or get worse. Document Released: 03/28/2005  Document Revised: 06/20/2011 Document Reviewed: 11/14/2007 [facility] Patient Information 2015 [employer] [place]. This information is not intended to replace advice given to you by your health care provider. Make sure you discuss any questions you have with your health care provider.

## 2014-08-19 MED FILL — Hydrocodone-Acetaminophen Tab 5-325 MG: ORAL | Qty: 6 | Status: AC

## 2014-12-12 IMAGING — CR DG ABDOMEN 1V
1 series · 1 of 1 positions shown · non-contrast
Comparison: None.

CLINICAL DATA: Abdominal pain

EXAM:
ABDOMEN - 1 VIEW

[view not recorded]
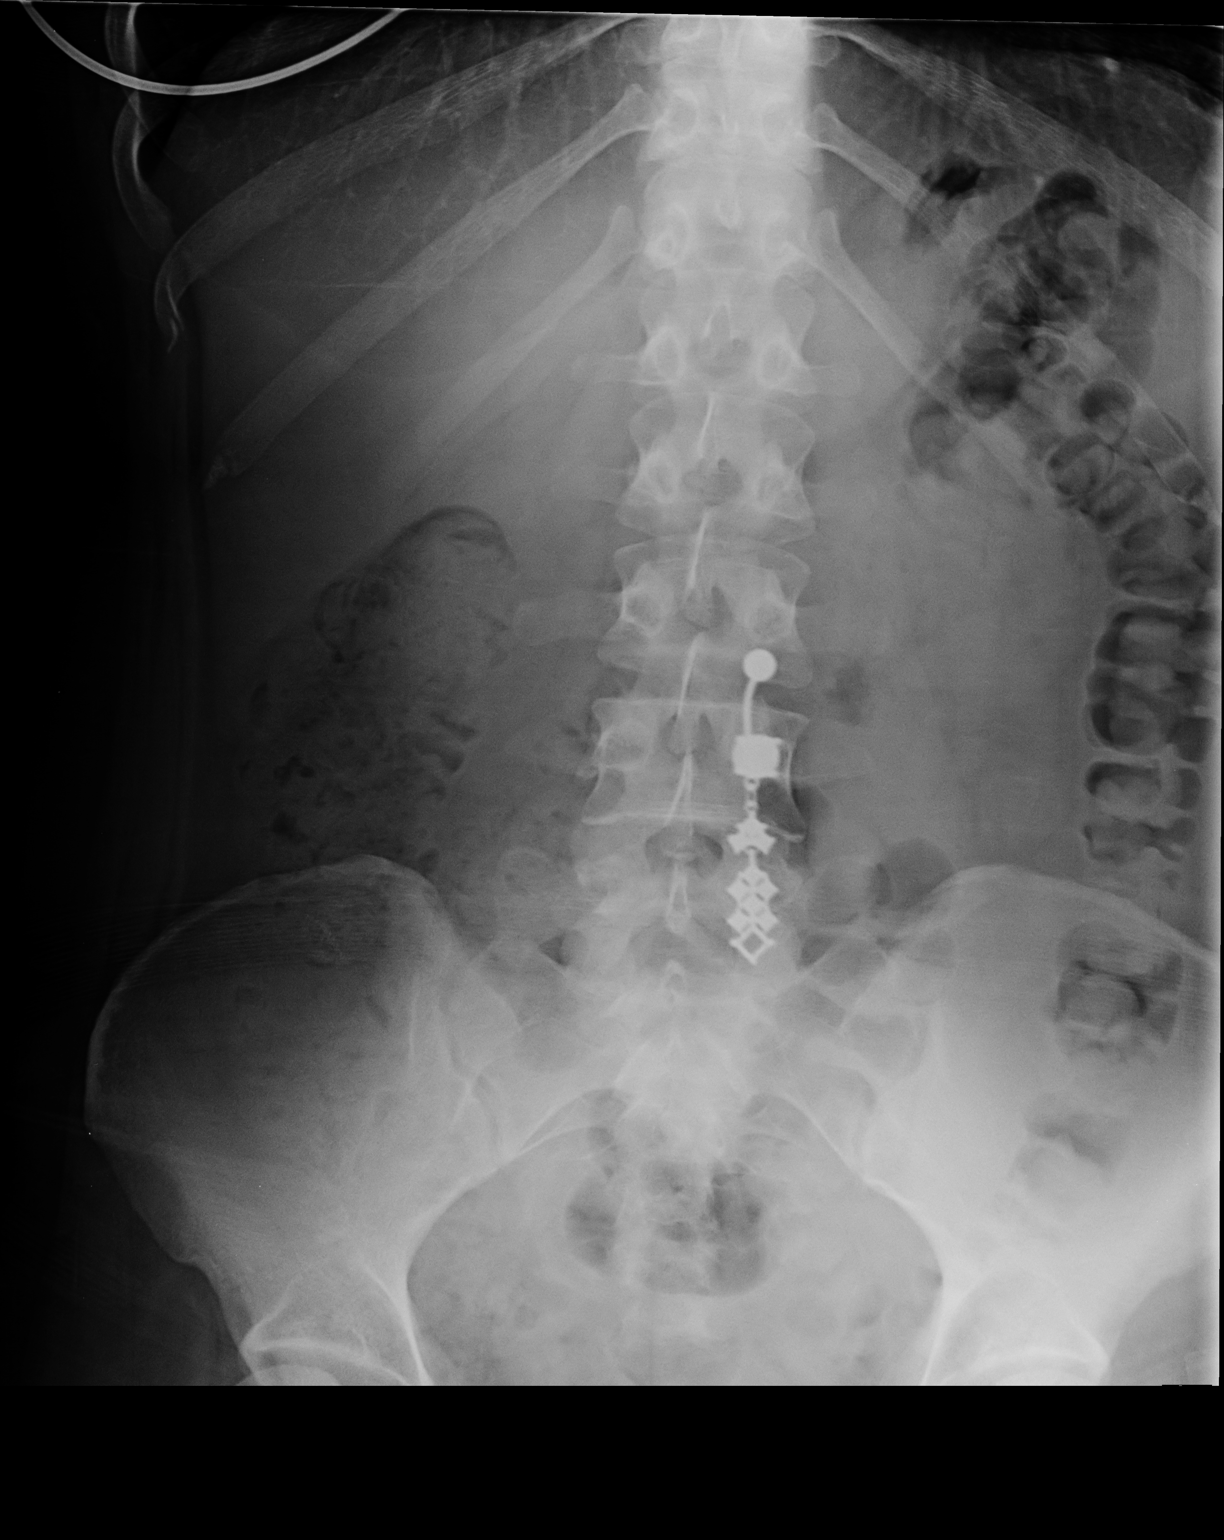

[1 of 1 positions shown; findings below may reference images not displayed]

FINDINGS: No bowel dilation is seen to suggest obstruction or adynamic ileus.
There is mild to moderate increased stool in the colon. Bowel gas
pattern otherwise unremarkable. Normal soft tissues and bony
structures.
IMPRESSION: Mild to moderate increased stool in the colon. Exam otherwise
unremarkable. No acute findings.

## 2015-03-03 ENCOUNTER — Encounter (HOSPITAL_COMMUNITY): Payer: Self-pay

## 2015-03-03 ENCOUNTER — Emergency Department (HOSPITAL_COMMUNITY)
Admission: EM | Admit: 2015-03-03 | Discharge: 2015-03-03 | Disposition: A | Discharge status: Home / Self Care | Payer: Medicaid Other | Attending: Emergency Medicine | Admitting: Emergency Medicine

## 2015-03-03 DIAGNOSIS — Y998 Other external cause status: Secondary | ICD-10-CM | POA: Diagnosis not present

## 2015-03-03 DIAGNOSIS — R519 Headache, unspecified: Secondary | ICD-10-CM

## 2015-03-03 DIAGNOSIS — S161XXA Strain of muscle, fascia and tendon at neck level, initial encounter: Secondary | ICD-10-CM | POA: Diagnosis not present

## 2015-03-03 DIAGNOSIS — Y9389 Activity, other specified: Secondary | ICD-10-CM | POA: Insufficient documentation

## 2015-03-03 DIAGNOSIS — Z792 Long term (current) use of antibiotics: Secondary | ICD-10-CM | POA: Insufficient documentation

## 2015-03-03 DIAGNOSIS — S199XXA Unspecified injury of neck, initial encounter: Secondary | ICD-10-CM | POA: Diagnosis present

## 2015-03-03 DIAGNOSIS — Z79899 Other long term (current) drug therapy: Secondary | ICD-10-CM | POA: Diagnosis not present

## 2015-03-03 DIAGNOSIS — Z7952 Long term (current) use of systemic steroids: Secondary | ICD-10-CM | POA: Insufficient documentation

## 2015-03-03 DIAGNOSIS — Z79818 Long term (current) use of other agents affecting estrogen receptors and estrogen levels: Secondary | ICD-10-CM | POA: Diagnosis not present

## 2015-03-03 DIAGNOSIS — S0990XA Unspecified injury of head, initial encounter: Secondary | ICD-10-CM | POA: Insufficient documentation

## 2015-03-03 DIAGNOSIS — F17200 Nicotine dependence, unspecified, uncomplicated: Secondary | ICD-10-CM | POA: Insufficient documentation

## 2015-03-03 DIAGNOSIS — R51 Headache: Secondary | ICD-10-CM

## 2015-03-03 DIAGNOSIS — Y92410 Unspecified street and highway as the place of occurrence of the external cause: Secondary | ICD-10-CM | POA: Diagnosis not present

## 2015-03-03 MED ORDER — IBUPROFEN 200 MG PO TABS
600.0000 mg | ORAL_TABLET | Freq: Once | ORAL | Status: AC
  Administered 2015-03-03: 600 mg via ORAL
  Filled 2015-03-03: qty 3

## 2015-03-03 MED ORDER — CYCLOBENZAPRINE HCL 10 MG PO TABS
5.0000 mg | ORAL_TABLET | Freq: Once | ORAL | Status: DC
  Filled 2015-03-03: qty 1

## 2015-03-03 MED ORDER — CYCLOBENZAPRINE HCL 5 MG PO TABS: 5.0000 mg | ORAL_TABLET | Freq: Three times a day (TID) | ORAL | Status: AC | PRN

## 2015-03-03 MED ORDER — IBUPROFEN 600 MG PO TABS: 600.0000 mg | ORAL_TABLET | Freq: Four times a day (QID) | ORAL | Status: AC | PRN

## 2015-03-03 NOTE — Discharge Instructions (Signed)
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
## 2015-10-02 IMAGING — DX DG CHEST 2V
2 series · 2 of 2 positions shown · non-contrast
Comparison: None.

CLINICAL DATA: 18-year-old female with productive cough and mid
chest pain

EXAM:
CHEST  2 VIEW

[chest pa]
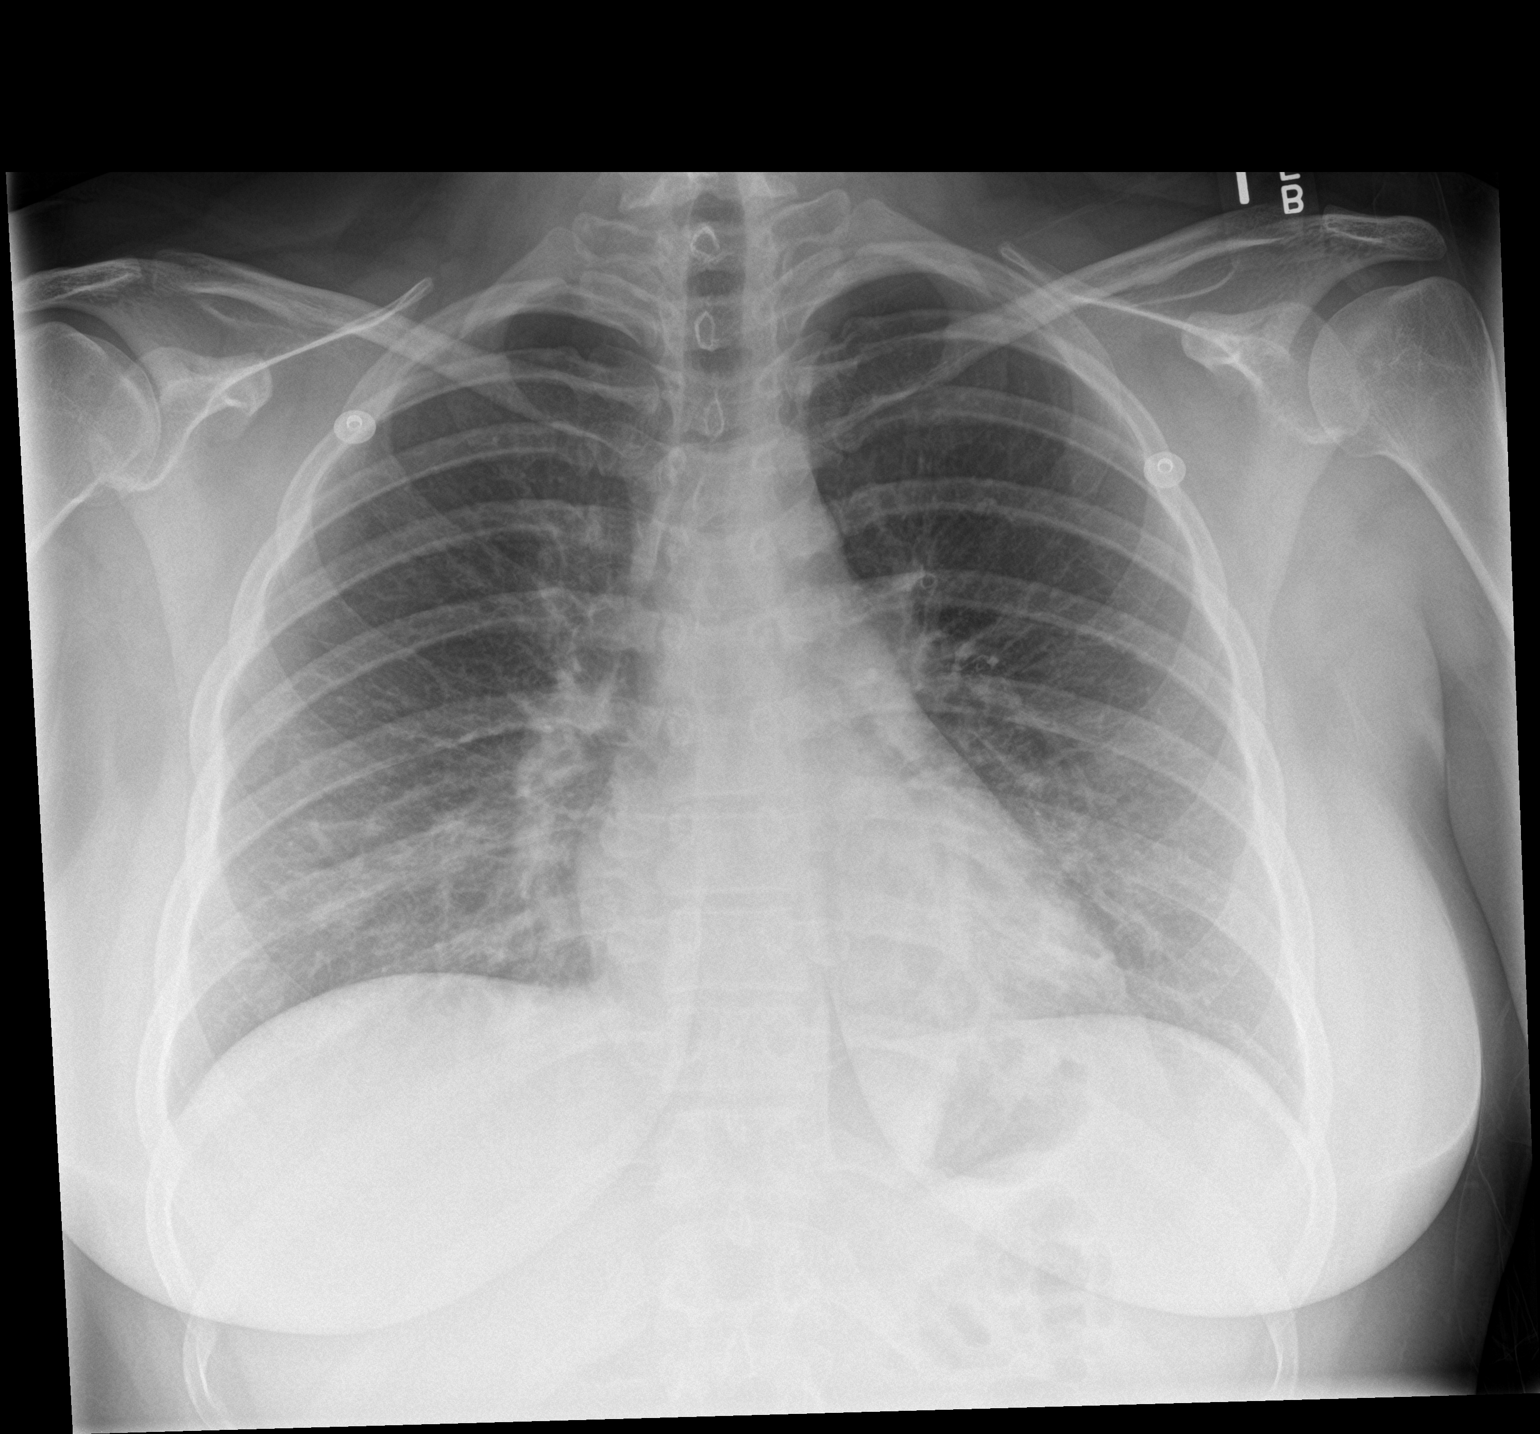

[chest lat]
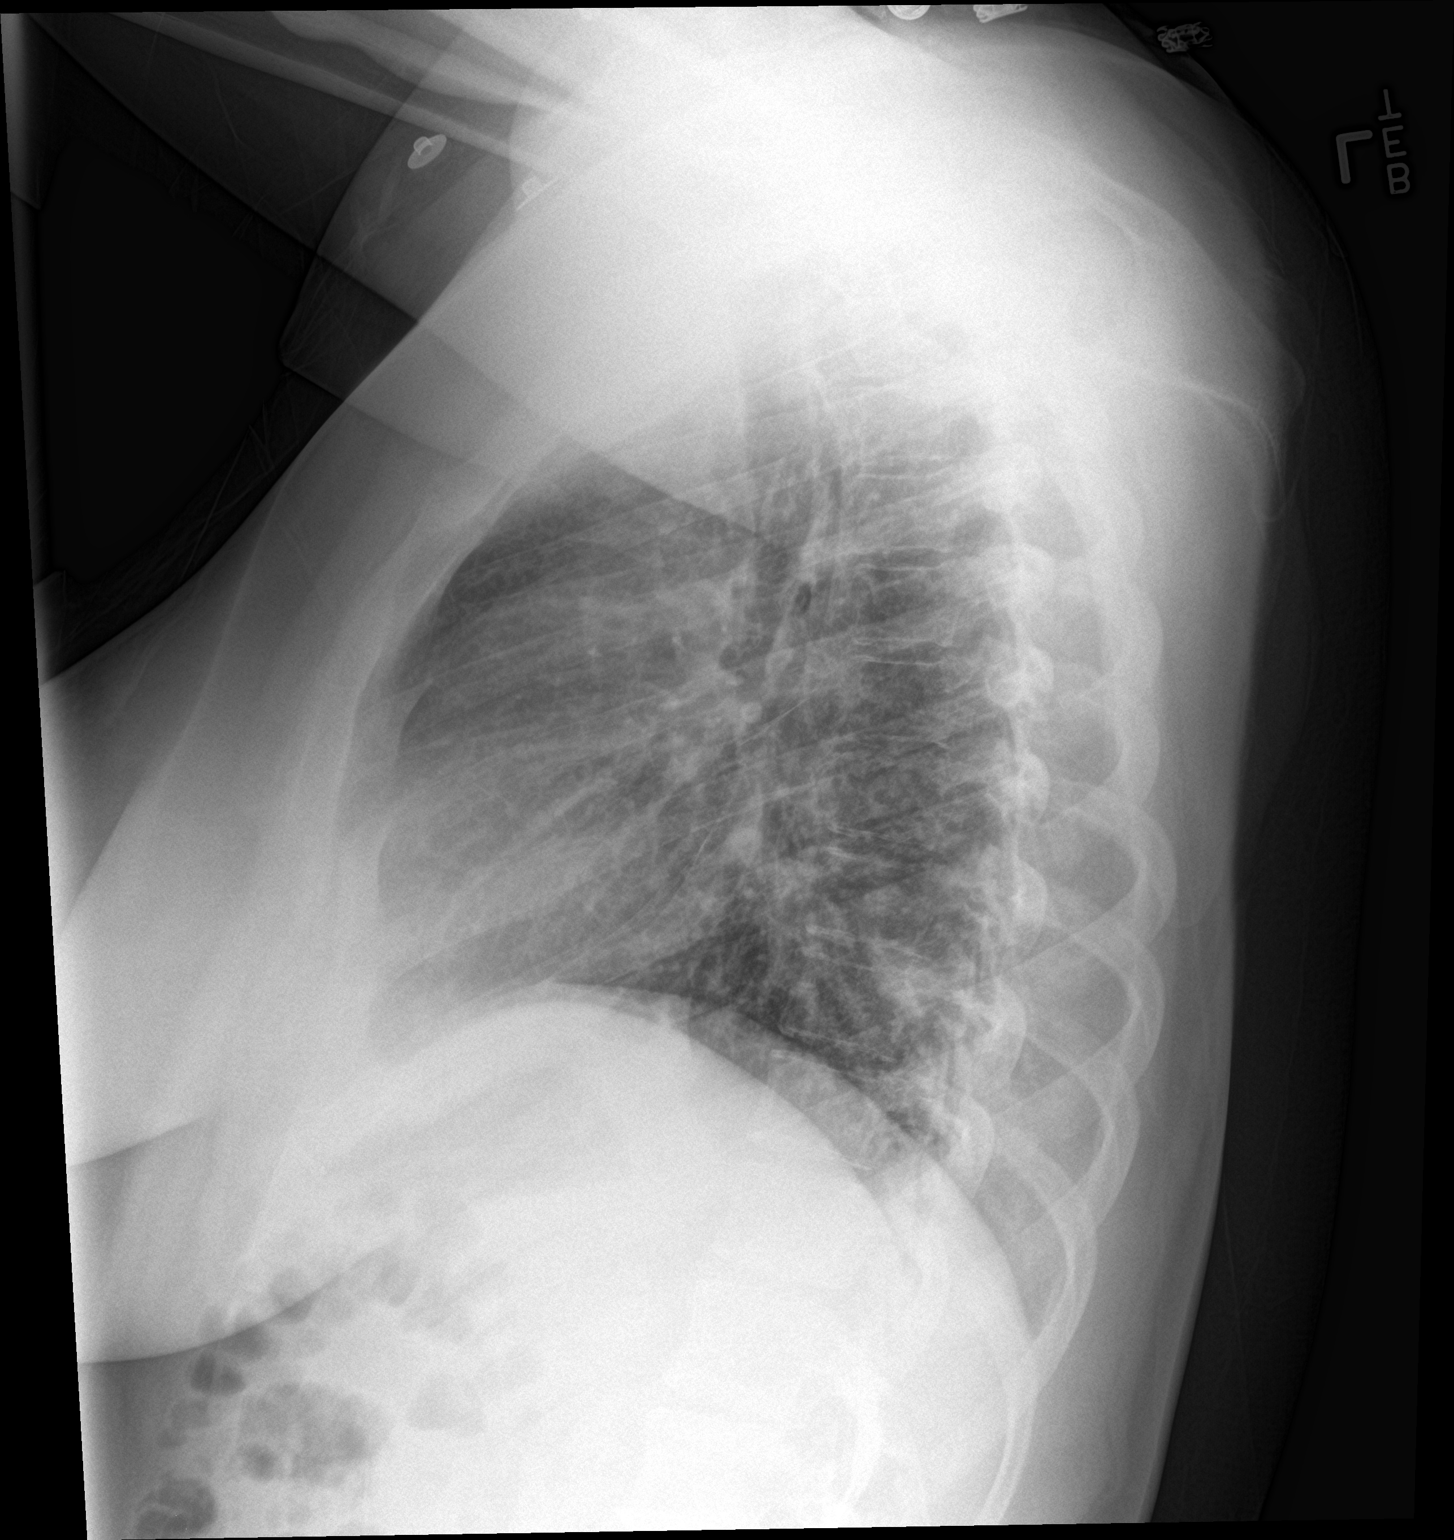

[2 of 2 positions shown; findings below may reference images not displayed]

FINDINGS: No focal airspace consolidation. Diffuse bronchial wall thickening/
peribronchial cuffing most notable on the lateral view. Cardiac and
mediastinal contours are within normal limits. No pleural effusion
or pneumothorax. No acute osseous abnormality.
IMPRESSION: 1. Perihilar and lower lobe bronchial wall thickening. Differential
considerations include acute bronchitis, viral respiratory infection
and chronic inflammatory process such as asthma.
2. No focal airspace consolidation to suggest pneumonia.
